# Patient Record
Sex: Male | Born: 1990 | Race: White | Hispanic: No | Marital: Married | Smoking: Current some day smoker
Health system: Southern US, Community
[De-identification: ages and names within clinical notes are randomized; demographics above are authoritative.]

## PROBLEM LIST (undated history)

## (undated) DIAGNOSIS — I319 Disease of pericardium, unspecified: Secondary | ICD-10-CM

## (undated) DIAGNOSIS — F319 Bipolar disorder, unspecified: Secondary | ICD-10-CM

## (undated) DIAGNOSIS — F909 Attention-deficit hyperactivity disorder, unspecified type: Secondary | ICD-10-CM

## (undated) HISTORY — DX: Disease of pericardium, unspecified: I31.9

## (undated) HISTORY — DX: Attention-deficit hyperactivity disorder, unspecified type: F90.9

## (undated) HISTORY — DX: Bipolar disorder, unspecified: F31.9

---
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
## 2010-09-28 NOTE — Discharge Summary (Signed)
NAME:  Ricky Robinson, RASNIC                  ACCOUNT NO.:  [id number]   MEDICAL RECORD NO.:  [id number]          PATIENT TYPE:  OBV   LOCATION:  6151                         FACILITY:  [facility]   PHYSICIAN:  Orie Rout, M.D.DATE OF BIRTH:  1991-01-29   DATE OF ADMISSION:  02/14/2006  DATE OF DISCHARGE:  02/16/2006                                 DISCHARGE SUMMARY   REASON FOR ADMISSION:  Chest pain.   SIGNIFICANT FINDINGS AND TREATMENT:  Gerrell is a 20 year old white male with a  history of bipolar disorder and a 1-week history of left-sided crampy chest  pain associated with nausea, vomiting and lethargy.  He was recently  admitted from October 2 through February 13, 2006, with the same symptoms,  received an extensive workup including EKG, CT of the abdomen and pelvis, D-  dimer, chest x-ray, all of which were negative.  He was diagnosed with  costochondritis and discharged to home with scheduled Motrin.  The patient  continued to have severe chest pain and anxiety with vomiting and was  readmitted on February 15, 2006.  His chest pain throughout this admission was  reproducible with palpation of the chest wall and somewhat relieved by  leaning forward.  A 2-dimensional echocardiogram was within normal limits  and showed no signs of pericarditis.   LABS ON ADMISSION:  Remarkable for a white blood cell count of 7.1.  Lipase  was 23, amylase was 84.  Urine tox screen was positive for opioids (patient  was on morphine during previous admission).  Evelyn was started on IV Toradol,  then switched to Motrin 800 mg by mouth, 3 times daily for costochondritis.  In addition, as he likely has a reflux component to his discomfort, he is  being treated with Protonix 40 mg p.o. twice daily.  Beacher does have several  major social stressors increasing his general anxiety level.  Prior to  discharge, he was transitioned from ibuprofen to Naproxen by mouth 3 times  daily.  Mikhail's chest pain improved, and he was  able to manage it with  strictly oral meds prior discharge.   FINAL DIAGNOSES:  1. Costochondritis.  2. Gastroesophageal reflux disease.  3. Anxiety.  4. Likely viral upper respiratory infection.   DISCHARGE MEDICATIONS AND INSTRUCTIONS:  1. Naproxen 375 mg p.o. t.i.d.  2. Risperdal 0.5 mg p.o. t.i.d.  3. Omeprazole 40 mg p.o. b.i.d.   FOLLOWUP:  1. Dr. Hyacinth Meeker (PCP) scheduled for Tuesday, October 9th at 10 a.m.  2. Dr. Toni Arthurs (psychiatrist) to be scheduled within 1 week of discharge.   DISCHARGE CONDITION:  Stable.   This report was faxed to his primary care physician, Dr. Hyacinth Meeker, at [phone] on February 16, 2006 as well as to Dr. Toni Arthurs at [phone] on  February 16, 2006.   DICTATED BY:  Bolivar Haw, MD           ______________________________  Orie Rout, M.D.     OA/MEDQ  D:  02/16/2006  T:  02/16/2006  Job:  [id number]

## 2010-09-28 NOTE — Op Note (Signed)
NAME:  Ricky Robinson, EDMONSTON NO.:  [id number]   MEDICAL RECORD NO.:  [id number]          PATIENT TYPE:  EMS   LOCATION:  MAJO                         FACILITY:  [facility]   PHYSICIAN:  Kerrin Champagne, M.D.   DATE OF BIRTH:  04/14/1991   DATE OF PROCEDURE:  05/25/2005  DATE OF DISCHARGE:                                 OPERATIVE REPORT   PREOPERATIVE DIAGNOSIS:  Closed left totally displaced dorsal Salter-Harris  II fracture of the distal radius physis.   POSTOPERATIVE DIAGNOSIS:  Closed left totally displaced dorsal Salter-Harris  II fracture of the distal radius physis.   PROCEDURE:  Closed manipulation of left distal radius Salter-Harris II  fracture and application of sugar-tong splint.   SURGEON:  Kerrin Champagne, M.D.   ASSISTANT:  None.   ANESTHESIA:  General via orotracheal intubation.   ANESTHESIOLOGIST:  Janetta Hora. Gelene Mink, M.D.   ESTIMATED BLOOD LOSS:  0 mL.   SPECIMENS:  None.   COMPLICATIONS:  None.   CONDITION:  The patient returned to the PACU in good condition.   BRIEF CLINICAL HISTORY:  A 20 year old right-hand-dominant male while roller-  skating tonight took a fall, landing on his left outstretched arm,  sustaining a left distal radius fracture, seen at Urgent Care with Dr.  Lesle Chris and referred for treatment.  I am on call to the at [facility], so the patient was seen in the emergency room and immediately  brought upstairs within 2 hours of his being seen in the emergency room,  here to undergo a closed manipulation under general anesthetic with  application of splint, his present splint in excellent condition.  Plain  radiographs with a 100% dorsal displacement of the distal radius Salter  Harris II fracture of the physis.   DESCRIPTION OF PROCEDURE:  After adequate general anesthesia, the patient  underwent manipulation of the left distal radius; this was done by  longitudinal traction and then extension of the fracture  site and then  direct pressure over the dorsal aspect of the distal fragment, reducing it  then with flexion into excellent anatomic position and alignment.  Observed  on the mini-C-arm to be in anatomic position and alignment.  The patient  then had application of a left long arm sugar-tong splint using 10 layers of  plaster cast material.  Well-padded with Ace wraps.  Following application  of the splint, the patient underwent repeat C-arm evaluation, demonstrating  the fracture remained reduced 100%, replaced into anatomic position and  alignment.  The patient was then reactivated,  extubated, and returned to the recovery room in satisfactory condition.  All  instrument and sponge counts were correct.  The patient will be discharged  home from the recovery room this evening on hydrocodone 5/325 mg one to two  q.4-6 h. p.r.n. pain, to be seen back the office in 1 week.      Kerrin Champagne, M.D.  Electronically Signed     JEN/MEDQ  D:  05/25/2005  T:  05/27/2005  Job:  [id number]   cc:   Brett Canales  Bethann Berkshire, M.D.  Fax: [phone]

## 2010-09-28 NOTE — Discharge Summary (Signed)
NAME:  Ricky Robinson, Ricky Robinson                  ACCOUNT NO.:  [id number]   MEDICAL RECORD NO.:  [id number]          PATIENT TYPE:  INP   LOCATION:  6149                         FACILITY:  [facility]   PHYSICIAN:  Levander Campion, M.D.  DATE OF BIRTH:  [birth year]   DATE OF ADMISSION:  02/11/2006  DATE OF DISCHARGE:  02/13/2006                                 DISCHARGE SUMMARY   REASON FOR HOSPITALIZATION:  Chest pain x6 days and intermittent episodes of  lethargy, dizziness, nausea and vomiting with exertion x1 year.   SIGNIFICANT FINDINGS:  Menno had anterior chest wall tenderness to palpation,  consistent with the diagnosis of costochondritis.  Colt had an EKG that was  within normal limits.  The chest x-ray that was within normal limits.  CT of  the abdomen and pelvis that was within normal limits.  He had no events on  telemetry during his hospitalization.  He had a CBC as follows:  White blood  count 7.1, hemoglobin 15.3, hematocrit 44.6, platelets 235.  A basic  metabolic panel as follows:  Sodium 137, potassium 4.1, chloride 105, bicarb  24, BUN 12, creatinine 0.8, glucose 96, calcium 9.6, total protein was 6.3,  albumin was 4.3, AST was 25, ALT was 20, alk. phos. was 263, T-bili was 0.6,  CK was 96, CK-MB was 1.6, a D-dimer was less than 0.22, lithium level was  less than 0.25.  A urine drug screen was negative.  His UA had a specific  gravity of 1.029.  It was negative for glucose and negative for protein.  SERP showed no evidence of inflammation at a level of 0.0.  Tymon received 4  mg of morphine, 2 mg of Dilaudid and 4 mg of Zofran in the emergency  department.  He was admitted to the pediatric teaching service for  monitoring overnight given his symptoms of chest pain.  His chest pain  continued to bother him.  He was diagnosed with costochondritis given his  anterior chest wall being tender to palpation on exam.  He was treated with  800 mg of ibuprofen t.i.d.   DISCHARGE DIAGNOSES:  1.  Costochondritis.  2. Overexertion/dehydration is thought to be responsible for the      dizziness, lethargy, nausea and vomiting.   DISCHARGE MEDICATIONS:  1. Ibuprofen 800 mg p.o. t.i.d. x14 days.  2. Omeprazole 40 mg p.o. b.i.d. x14 days.   The patient is to increase his p.o. intake and needs to rest from sports x2  weeks.  He is to restart his lithium and Risperdal per psychiatrist, Dr.  Alveda Reasons, instructions.   FOLLOWUP:  The patient is to follow up with Dr. Hyacinth Meeker on Tuesday, October 9  at 10 a.m. and Dr. Toni Arthurs, his mother is to make an appointment for Tuesday,  Wednesday or Thursday next week.           ______________________________  Levander Campion, M.D.     JH/MEDQ  D:  02/13/2006  T:  02/13/2006  Job:  [id number]

## 2011-06-17 ENCOUNTER — Ambulatory Visit (INDEPENDENT_AMBULATORY_CARE_PROVIDER_SITE_OTHER): Payer: BC Managed Care – PPO | Admitting: Family Medicine

## 2011-06-17 VITALS — BP 112/73 | HR 87 | Temp 98.5°F | Resp 20 | Ht 69.5 in | Wt 232.0 lb

## 2011-06-17 DIAGNOSIS — J069 Acute upper respiratory infection, unspecified: Secondary | ICD-10-CM

## 2011-06-17 DIAGNOSIS — F319 Bipolar disorder, unspecified: Secondary | ICD-10-CM | POA: Insufficient documentation

## 2011-06-17 DIAGNOSIS — J029 Acute pharyngitis, unspecified: Secondary | ICD-10-CM

## 2011-06-17 DIAGNOSIS — F909 Attention-deficit hyperactivity disorder, unspecified type: Secondary | ICD-10-CM

## 2011-06-17 LAB — POCT RAPID STREP A (OFFICE): Rapid Strep A Screen: NEGATIVE

## 2011-06-17 MED ORDER — BENZONATATE 100 MG PO CAPS: 100.0000 mg | ORAL_CAPSULE | Freq: Two times a day (BID) | ORAL | Status: AC | PRN

## 2011-06-17 NOTE — Progress Notes (Signed)
  Subjective:    Patient ID: Ricky Robinson, male    DOB: 05-16-1990, 21 y.o.   MRN: [id number]  HPI Patient developed a bad sore throat last night. It hurt behind his nose like he had postnasal drainage at that morning to come out but wouldn't. Not coughing much. During the night he had night sweats. He also developed nausea and vomiting a number of times. He has not been eating or drinking today. He did take a little ibuprofen.   Review of Systems No other major complaints. See above     Objective:    Physical Exam  Somewhat ill appearing. TMs normal. Nose and inflamed. Throat erythema with mild swelling of the soft tissues. Next upper without significant nodes chest clear to auscultation abdomen soft is a little tender in the right lower quadrant. He said he been having some pain in the right lower abdomen and groin area. No abnormalities could be observed, excepting mildly tender on palpation. No hernias were noted.      Assessment & Plan:  Viral syndrome and pharyngitis  I did do a strep test on him which was negative.

## 2011-06-17 NOTE — Patient Instructions (Signed)
Patient was instructed to drink plenty of fluids and get enough rest.  Cough pills were called in to his pharmacy. If he is doing worse he can get them filled. In the meanwhile he is advised to take some Claritin-D for decongestion, drink plenty of fluids, and can try using some salt water gargle.  He is given an excuse for work for today but if necessary I will give him more time off.

## 2012-01-28 ENCOUNTER — Ambulatory Visit (INDEPENDENT_AMBULATORY_CARE_PROVIDER_SITE_OTHER): Payer: BC Managed Care – PPO | Admitting: Physician Assistant

## 2012-01-28 VITALS — BP 123/80 | HR 80 | Temp 97.6°F | Resp 18 | Ht 71.0 in | Wt 237.0 lb

## 2012-01-28 DIAGNOSIS — J029 Acute pharyngitis, unspecified: Secondary | ICD-10-CM

## 2012-01-28 DIAGNOSIS — J309 Allergic rhinitis, unspecified: Secondary | ICD-10-CM

## 2012-01-28 DIAGNOSIS — J020 Streptococcal pharyngitis: Secondary | ICD-10-CM

## 2012-01-28 LAB — POCT RAPID STREP A (OFFICE): Rapid Strep A Screen: POSITIVE — AB

## 2012-01-28 MED ORDER — FLUTICASONE PROPIONATE 50 MCG/ACT NA SUSP: 2.0000 | Freq: Every day | NASAL | Status: DC

## 2012-01-28 MED ORDER — PENICILLIN G BENZATHINE 1200000 UNIT/2ML IM SUSP
1.2000 10*6.[IU] | Freq: Once | INTRAMUSCULAR | Status: AC
  Administered 2012-01-28: 1.2 10*6.[IU] via INTRAMUSCULAR

## 2012-01-28 NOTE — Progress Notes (Signed)
  Subjective:    Patient ID: Ricky Robinson, male    DOB: 07/17/1990, 21 y.o.   MRN: [id number]  HPI 21 year old male presents with 11 day history of nasal congestion, cough, postnasal drainage, sore throat, sinus pressure, and ear pressure. Denies fever, chills, nausea, vomiting, headache, dizziness, or abdominal pain. He does admit that this has been intermittent in nature since the onset and that his symptoms seem to be much worse when he is outside.  No documented history of allergies, but he does think that that could be contributing to his illness.  He has not been taking any OTC medications for this except ibuprofen.  Does have a history of strep infection in childhood. Had a tonsillectomy. His mother has recently been treated for strep and so he has been exposed.     Review of Systems  All other systems reviewed and are negative.       Objective:   Physical Exam  Constitutional: He is oriented to person, place, and time. He appears well-developed and well-nourished.  HENT:  Head: Normocephalic and atraumatic.  Right Ear: External ear normal.  Left Ear: External ear normal.  Mouth/Throat: No oropharyngeal exudate (clear postnasal drainage).  Eyes: Conjunctivae normal are normal.  Neck: Normal range of motion.  Cardiovascular: Normal rate, regular rhythm and normal heart sounds.   Pulmonary/Chest: Effort normal and breath sounds normal.  Lymphadenopathy:    He has no cervical adenopathy.  Neurological: He is alert and oriented to person, place, and time.  Psychiatric: He has a normal mood and affect. His behavior is normal. Judgment and thought content normal.      Results for orders placed in visit on 01/28/12  POCT RAPID STREP A (OFFICE)      Component Value Range   Rapid Strep A Screen Positive (*) Negative       Assessment & Plan:   1. Acute pharyngitis  POCT rapid strep A  2. Allergic rhinitis    Bicillin given today. Will treat allergic rhinitis with Flonase and  OTC Zyrtec Recommend year round treatment of AR due to severity of symptoms.  Follow up as needed.

## 2012-01-28 NOTE — Patient Instructions (Addendum)
Take Zyrtec (cetirizine) daily in the morning Use Flonase either 2 sprays once daily or 1 spray each nostril twice daily

## 2014-11-09 ENCOUNTER — Ambulatory Visit (INDEPENDENT_AMBULATORY_CARE_PROVIDER_SITE_OTHER): Payer: BLUE CROSS/BLUE SHIELD

## 2014-11-09 ENCOUNTER — Ambulatory Visit (INDEPENDENT_AMBULATORY_CARE_PROVIDER_SITE_OTHER): Payer: BLUE CROSS/BLUE SHIELD | Admitting: Family Medicine

## 2014-11-09 VITALS — BP 143/88 | HR 99 | Temp 98.5°F | Resp 18 | Wt 241.0 lb

## 2014-11-09 DIAGNOSIS — M79644 Pain in right finger(s): Secondary | ICD-10-CM

## 2014-11-09 DIAGNOSIS — S60221A Contusion of right hand, initial encounter: Secondary | ICD-10-CM

## 2014-11-09 DIAGNOSIS — M25531 Pain in right wrist: Secondary | ICD-10-CM

## 2014-11-09 MED ORDER — MELOXICAM 15 MG PO TABS: 15.0000 mg | ORAL_TABLET | Freq: Every day | ORAL | Status: DC
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
## 2014-11-09 NOTE — Patient Instructions (Signed)
I do not see any fracture on your x-rays.   This likely means that you are just bruised, but I will let you know if the radiologist says anything different about your x-rays Use the splint for 3-4 days as needed for comfort- when you no longer have pain you can stop using the splint Please let me know if your pain persists beyond the next several days Use the mobic as needed for pain- one daily.  While you are using this do not use other NSAID medications like ibuprofen or aleve

## 2017-04-23 ENCOUNTER — Encounter: Payer: Self-pay | Admitting: Emergency Medicine

## 2017-04-23 ENCOUNTER — Ambulatory Visit: Payer: 59 | Admitting: Emergency Medicine

## 2017-04-23 ENCOUNTER — Other Ambulatory Visit: Payer: Self-pay

## 2017-04-23 ENCOUNTER — Telehealth: Payer: Self-pay | Admitting: *Deleted

## 2017-04-23 ENCOUNTER — Ambulatory Visit (INDEPENDENT_AMBULATORY_CARE_PROVIDER_SITE_OTHER): Payer: 59

## 2017-04-23 VITALS — BP 152/92 | HR 89 | Temp 99.2°F | Resp 16 | Ht 69.0 in | Wt 263.4 lb

## 2017-04-23 DIAGNOSIS — R0789 Other chest pain: Secondary | ICD-10-CM | POA: Diagnosis not present

## 2017-04-23 DIAGNOSIS — S299XXA Unspecified injury of thorax, initial encounter: Secondary | ICD-10-CM

## 2017-04-23 DIAGNOSIS — S20212A Contusion of left front wall of thorax, initial encounter: Secondary | ICD-10-CM | POA: Diagnosis not present

## 2017-04-23 MED ORDER — TRAMADOL HCL 50 MG PO TABS: 50.0000 mg | ORAL_TABLET | Freq: Three times a day (TID) | ORAL | 0 refills | Status: DC | PRN

## 2017-04-23 NOTE — Telephone Encounter (Signed)
Called patient's pharmacy spoke to [place] to cancel Tramadol, per Dr Alvy BimlerSagardia.
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
## 2017-04-23 NOTE — Patient Instructions (Addendum)
IF you received an x-ray today, you will receive an invoice from [facility]. Please contact [facility] at [phone] with questions or concerns regarding your invoice.   IF you received labwork today, you will receive an invoice from [facility]. Please contact [facility] at [phone] with questions or concerns regarding your invoice.   Our billing staff will not be able to assist you with questions regarding bills from these companies.  You will be contacted with the lab results as soon as they are available. The fastest way to get your results is to activate your My Chart account. Instructions are located on the last page of this paperwork. If you have not heard from us regarding the results in 2 weeks, please contact this office.     Chest Contusion, Adult A chest contusion is a deep bruise to the chest. Contusions are usually the result of a blunt injury to tissues under the skin. The injury can damage the small blood vessels under the skin, which causes bleeding under the skin. The skin overlying the contusion may turn blue, purple, or yellow. Minor injuries may give you a painless contusion, but more severe contusions may stay painful and swollen for a few weeks. What are the causes? A contusion is usually caused by a hard hit (blow), trauma, or direct force to your chest, such as:  A motor vehicle accident.  Falls.  Bicycle injuries.  Contact sport injuries.  What increases the risk? You may be at a higher risk for a chest contusion if you play a sport in which falls and contact are common, such as football or soccer. What are the signs or symptoms? Symptoms of this condition include:  Chest swelling.  Pain and tenderness of the chest.  Discomfort with certain movements of the upper torso.  Discoloration of the chest. The area may have redness and then turn blue, purple, or yellow.  Discomfort when taking deep breaths.  How is this  diagnosed? A chest contusion is diagnosed from a physical exam and your medical history. An X-ray may be needed to determine if there were any associated injuries, such as broken bones (fractures). Sometimes other tests such as CT scans, ultrasounds, or MRIs may be needed if internal injuries are suspected. A test that shows the amount of oxygen in your blood (pulse oximetry) may be done if you have trouble breathing. How is this treated? Often, the best treatment for a chest contusion is resting and applying ice to the injured area. Deep-breathing exercises may be recommended to reduce the risk of pneumonia. Oxygen therapy may be given if you have trouble breathing or have low oxygen levels. Over-the-counter medicines may also be recommended for pain control. Follow these instructions at home:  If directed, apply ice to the injured area. ? Put ice in a plastic bag. ? Place a towel between your skin and the bag. ? Leave the ice on for 20 minutes, 2-3 times per day.  Take over-the-counter and prescription medicines only as told by your health care provider.  Do any deep-breathing exercises as told by your health care provider, if this applies.  Do not lie down flat on your back. Keep your head and chest raised (elevated) when you are resting or sleeping.  Do not use any products that contain nicotine or tobacco, such as cigarettes and e-cigarettes. If you need help quitting, ask your health care provider.  Do not lift anything that causes you discomfort or pain. Contact a health  care provider if:  Your swelling or pain is not relieved with medicines or treatment.  You have increased bruising or swelling.  You have pain that is getting worse.  Your symptoms have not improved after one week. Get help right away if:  You have a sudden, significant increase in pain.  You have difficulty breathing.  You have dizziness, weakness, or fainting.  You have blood in your urine or  stool.  You cough up blood or you vomit blood. Summary  A chest contusion is a deep bruise to the chest that is usually caused by a hard hit, trauma, or direct force to your chest.  Treatment for a chest contusion may include resting and applying ice to the injured area.  Contact a health care provider if you have problems breathing or if your pain does not improve with treatment. This information is not intended to replace advice given to you by your health care provider. Make sure you discuss any questions you have with your health care provider. Document Released: 01/22/2001 Document Revised: 01/25/2016 Document Reviewed: 01/25/2016 Elsevier Interactive Patient Education  [employer]2018 Elsevier Inc.

## 2017-05-02 ENCOUNTER — Ambulatory Visit: Payer: 59 | Admitting: Emergency Medicine

## 2017-05-07 ENCOUNTER — Other Ambulatory Visit: Payer: Self-pay

## 2017-05-07 ENCOUNTER — Ambulatory Visit: Payer: 59 | Admitting: Emergency Medicine

## 2017-05-07 ENCOUNTER — Encounter: Payer: Self-pay | Admitting: Emergency Medicine

## 2017-05-07 VITALS — BP 122/90 | HR 92 | Temp 98.0°F | Resp 16 | Ht 69.0 in | Wt 260.4 lb

## 2017-05-07 DIAGNOSIS — M25511 Pain in right shoulder: Secondary | ICD-10-CM

## 2017-05-07 DIAGNOSIS — M7918 Myalgia, other site: Secondary | ICD-10-CM
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
## 2017-05-07 NOTE — Patient Instructions (Addendum)
     IF you received an x-ray today, you will receive an invoice from [facility]. Please contact [facility] at [phone] with questions or concerns regarding your invoice.   IF you received labwork today, you will receive an invoice from [facility]. Please contact [facility] at [phone] with questions or concerns regarding your invoice.   Our billing staff will not be able to assist you with questions regarding bills from these companies.  You will be contacted with the lab results as soon as they are available. The fastest way to get your results is to activate your My Chart account. Instructions are located on the last page of this paperwork. If you have not heard from us regarding the results in 2 weeks, please contact this office.     Shoulder Pain Many things can cause shoulder pain, including:  An injury.  Moving the arm in the same way again and again (overuse).  Joint pain (arthritis).  Follow these instructions at home: Take these actions to help with your pain:  Squeeze a soft ball or a foam pad as much as you can. This helps to prevent swelling. It also makes the arm stronger.  Take over-the-counter and prescription medicines only as told by your doctor.  If told, put ice on the area: ? Put ice in a plastic bag. ? Place a towel between your skin and the bag. ? Leave the ice on for 20 minutes, 2-3 times per day. Stop putting on ice if it does not help with the pain.  If you were given a shoulder sling or immobilizer: ? Wear it as told. ? Remove it to shower or bathe. ? Move your arm as little as possible. ? Keep your hand moving. This helps prevent swelling.  Contact a doctor if:  Your pain gets worse.  Medicine does not help your pain.  You have new pain in your arm, hand, or fingers. Get help right away if:  Your arm, hand, or fingers: ? Tingle. ? Are numb. ? Are swollen. ? Are painful. ? Turn white or blue. This information is  not intended to replace advice given to you by your health care provider. Make sure you discuss any questions you have with your health care provider. Document Released: 10/16/2007 Document Revised: 12/24/2015 Document Reviewed: 08/22/2014 Elsevier Interactive Patient Education  2018 Elsevier Inc.  

## 2019-05-11 ENCOUNTER — Ambulatory Visit: Payer: 59 | Attending: Internal Medicine

## 2019-05-27 IMAGING — DX DG RIBS W/ CHEST 3+V*L*
4 series · 4 of 4 positions shown · non-contrast
Comparison: No recent.

CLINICAL DATA: Left rib injury.  Pain.

EXAM:
LEFT RIBS AND CHEST - 3+ VIEW

[chest pa]
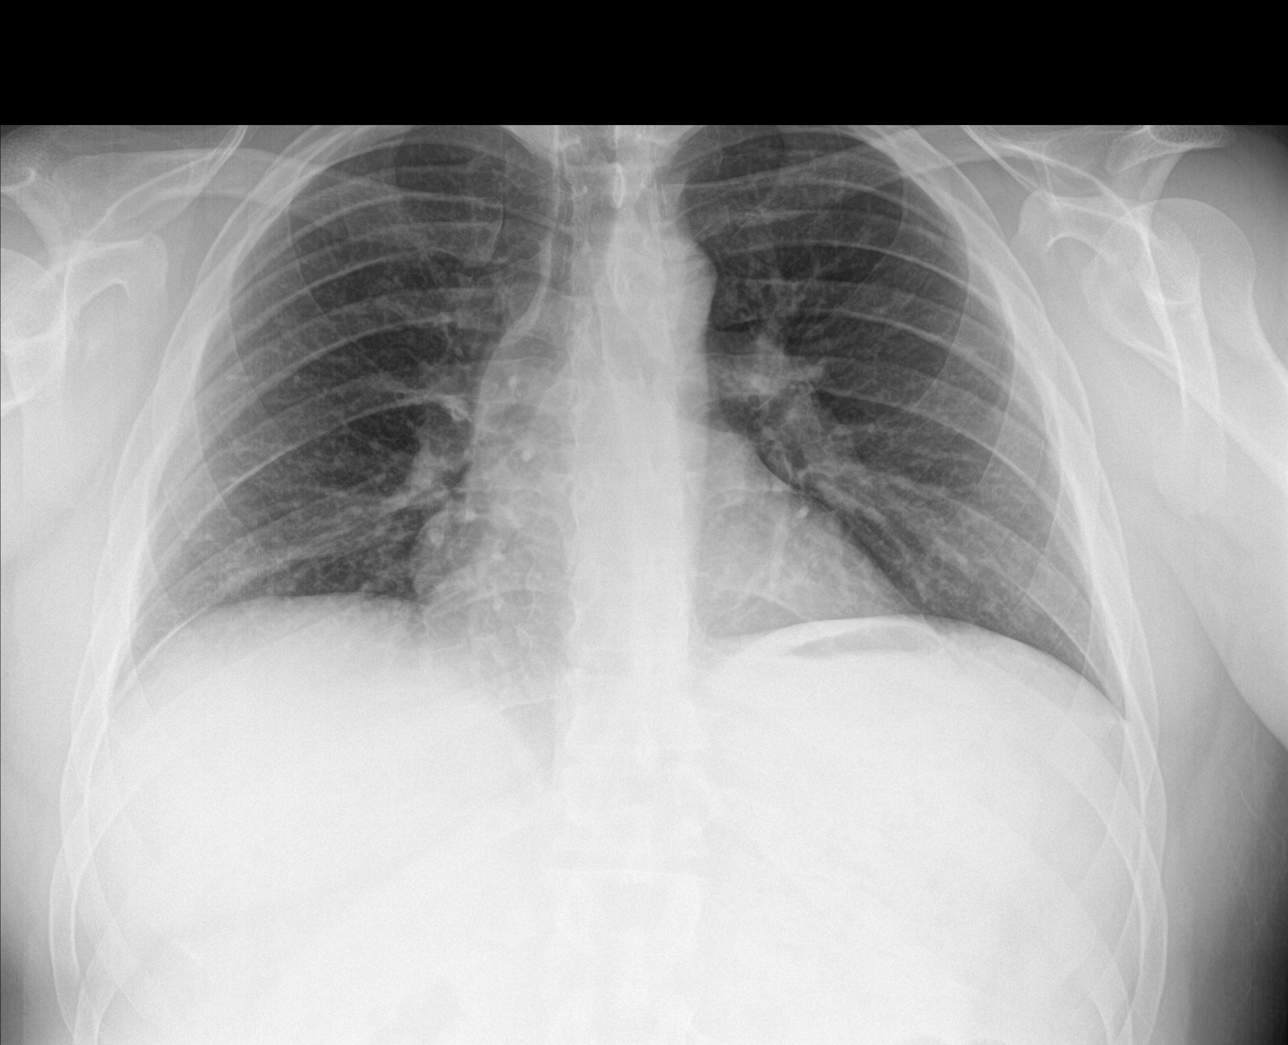

[rib pa]
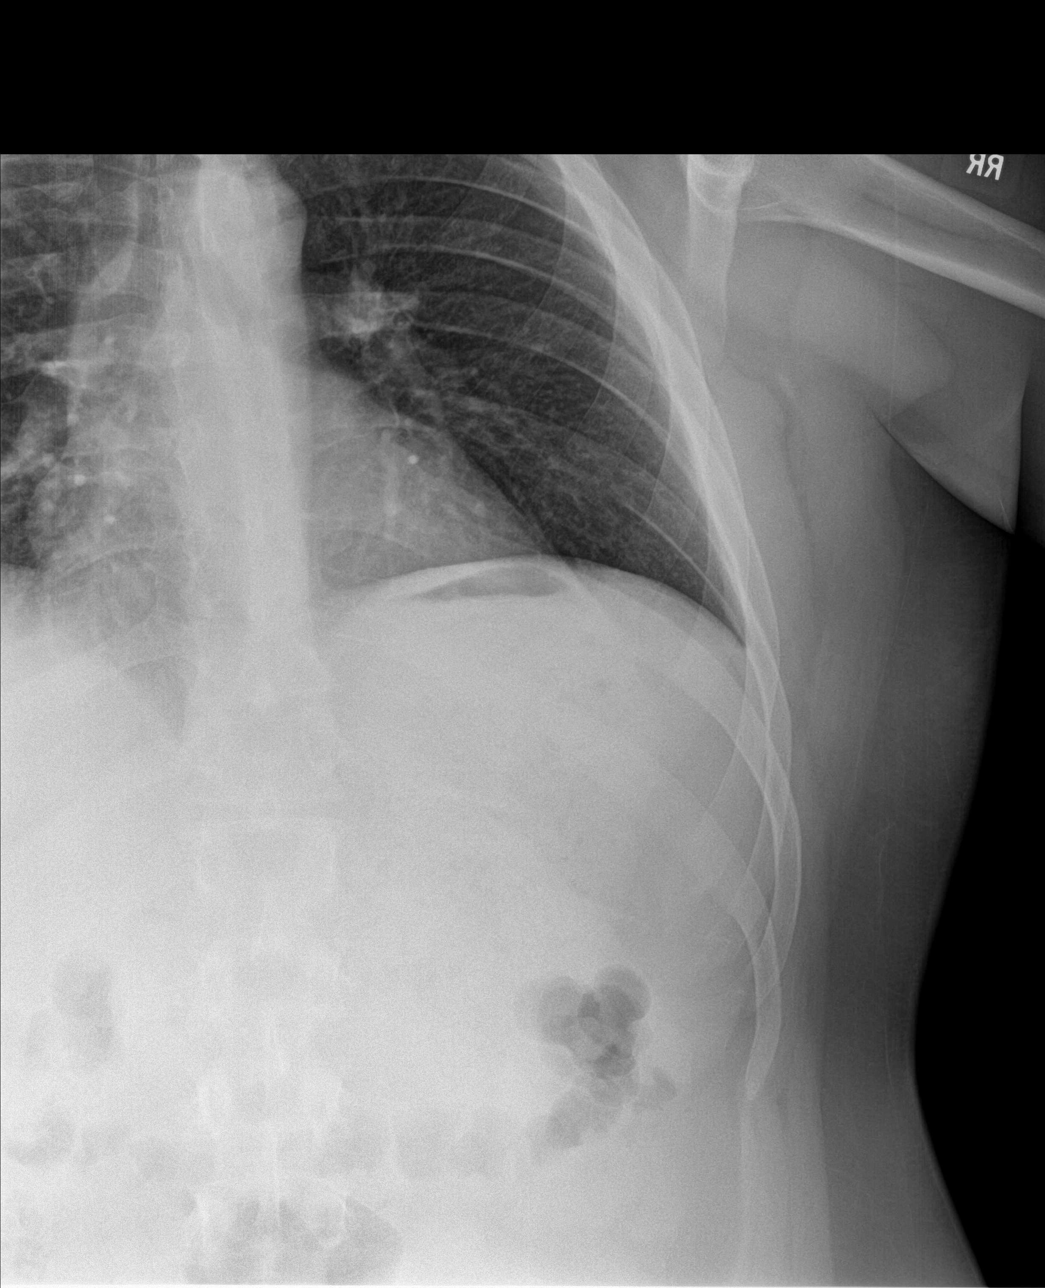

[rib obl (1 of 2)]
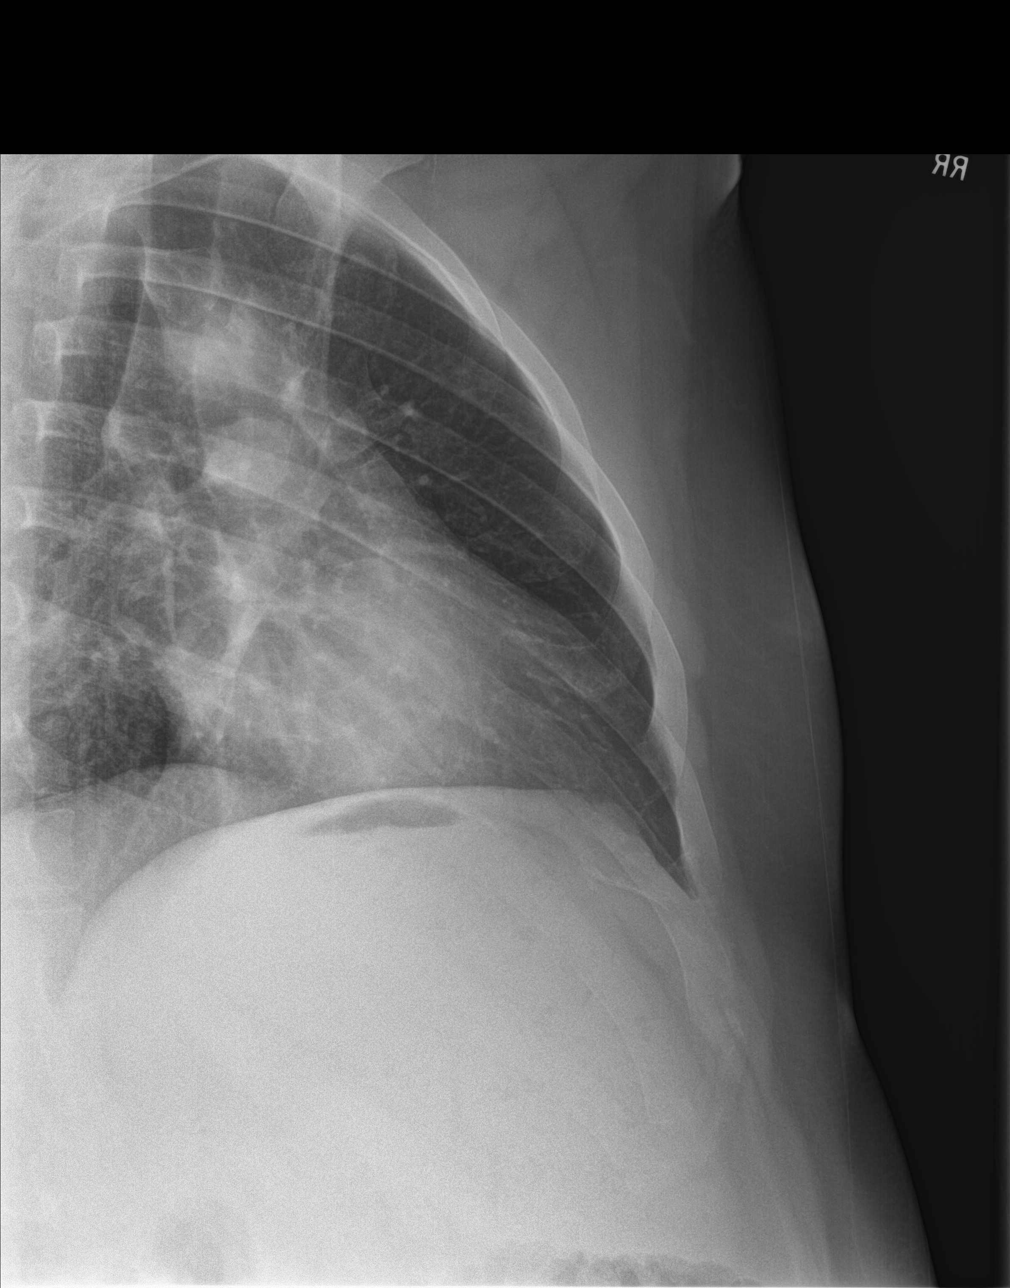

[rib obl (2 of 2)]
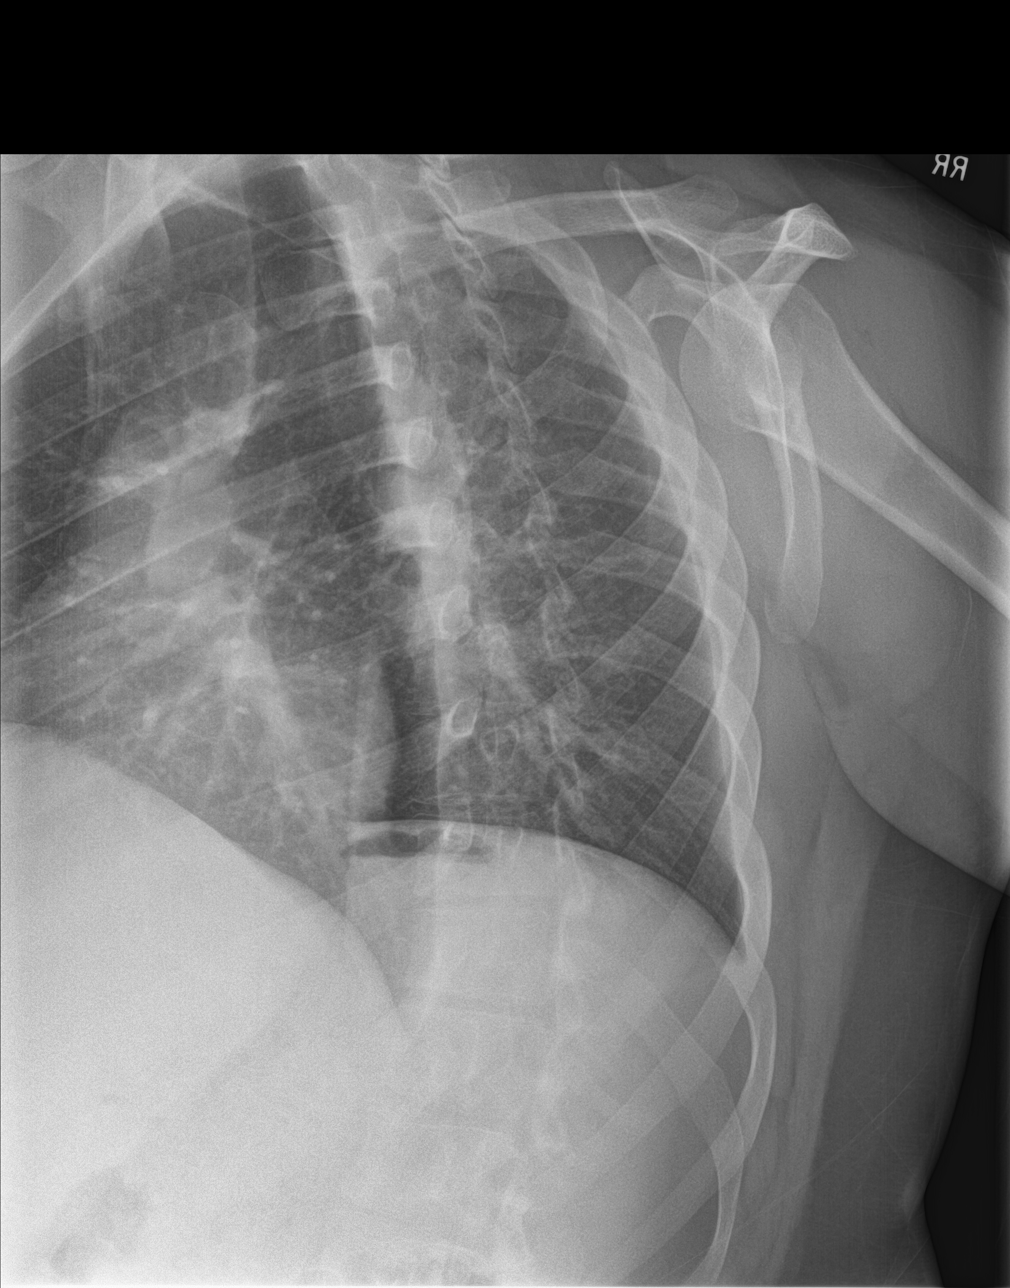

[4 of 4 positions shown; findings below may reference images not displayed]

FINDINGS: No fracture or other bone lesions are seen involving the ribs. There
is no evidence of pneumothorax or pleural effusion. Both lungs are
clear. Heart size and mediastinal contours are within normal limits.
IMPRESSION: No acute bony abnormality identified. No evidence of fracture. No
pneumothorax.
